# Patient Record
Sex: Male | Born: 1965 | Race: White | Hispanic: No | Smoking: Current every day smoker
Health system: Southern US, Community
[De-identification: ages and names within clinical notes are randomized; demographics above are authoritative.]

## PROBLEM LIST (undated history)

## (undated) DIAGNOSIS — J449 Chronic obstructive pulmonary disease, unspecified: Secondary | ICD-10-CM

## (undated) DIAGNOSIS — K219 Gastro-esophageal reflux disease without esophagitis: Secondary | ICD-10-CM

## (undated) DIAGNOSIS — I10 Essential (primary) hypertension: Secondary | ICD-10-CM

## (undated) DIAGNOSIS — M1990 Unspecified osteoarthritis, unspecified site: Secondary | ICD-10-CM

## (undated) DIAGNOSIS — F419 Anxiety disorder, unspecified: Secondary | ICD-10-CM

## (undated) DIAGNOSIS — Z8719 Personal history of other diseases of the digestive system: Secondary | ICD-10-CM

---

## 2011-05-03 ENCOUNTER — Emergency Department: Payer: Self-pay | Admitting: Emergency Medicine

## 2011-09-26 ENCOUNTER — Ambulatory Visit: Payer: Self-pay | Admitting: Sports Medicine

## 2011-12-05 ENCOUNTER — Other Ambulatory Visit: Payer: Self-pay

## 2011-12-05 ENCOUNTER — Encounter (HOSPITAL_COMMUNITY): Payer: Self-pay | Admitting: Pharmacy Technician

## 2011-12-05 ENCOUNTER — Telehealth: Payer: Self-pay

## 2011-12-05 DIAGNOSIS — K862 Cyst of pancreas: Secondary | ICD-10-CM

## 2011-12-05 NOTE — Telephone Encounter (Signed)
Pt has been scheduled for EUS for 12/07/11 pt has been instructed and meds reviewed pt will call with any questions or concerns

## 2011-12-06 ENCOUNTER — Encounter (HOSPITAL_COMMUNITY): Payer: Self-pay

## 2011-12-06 ENCOUNTER — Encounter (HOSPITAL_COMMUNITY)
Admission: RE | Admit: 2011-12-06 | Discharge: 2011-12-06 | Disposition: A | Discharge status: Home / Self Care | Payer: 59 | Source: Ambulatory Visit | Attending: Surgery | Admitting: Surgery

## 2011-12-06 HISTORY — PX: CERVICAL FUSION: SHX112

## 2011-12-06 HISTORY — DX: Unspecified osteoarthritis, unspecified site: M19.90

## 2011-12-06 HISTORY — DX: Personal history of other diseases of the digestive system: Z87.19

## 2011-12-06 HISTORY — DX: Chronic obstructive pulmonary disease, unspecified: J44.9

## 2011-12-06 HISTORY — DX: Gastro-esophageal reflux disease without esophagitis: K21.9

## 2011-12-06 HISTORY — DX: Essential (primary) hypertension: I10

## 2011-12-06 HISTORY — DX: Anxiety disorder, unspecified: F41.9

## 2011-12-06 LAB — BASIC METABOLIC PANEL
BUN: 14 mg/dL (ref 6–23)
Chloride: 101 mEq/L (ref 96–112)
Creatinine, Ser: 1.21 mg/dL (ref 0.50–1.35)
GFR calc Af Amer: 80 mL/min — ABNORMAL LOW (ref >90)

## 2011-12-06 LAB — CBC
HCT: 46.6 % (ref 39.0–52.0)
MCV: 96.5 fL (ref 78.0–100.0)
RDW: 14.5 % (ref 11.5–15.5)
WBC: 7.1 10*3/uL (ref 4.0–10.5)

## 2011-12-06 NOTE — Patient Instructions (Addendum)
20 Bruce [name]  12/06/2011   Your procedure is scheduled on:  11-14 -2013  Report to [facility] Admitting at    0630    AM .  Call this number if you have problems the morning of surgery: [phone] Endoscopy   Remember:   Do not eat food:After Midnight.    Take these medicines the morning of surgery with A SIP OF WATER: none. Donot take Lisinopril.Bring Dulera and use.    Do not wear jewelry, make-up or nail polish.  Do not wear lotions, powders, or perfumes. You may wear deodorant.  Do not shave 48 hours prior to surgery.(face and neck okay, no shaving of legs)  Do not bring valuables to the hospital.  Contacts, dentures or bridgework may not be worn into surgery.  Leave suitcase in the car. After surgery it may be brought to your room.  For patients admitted to the hospital, checkout time is 11:00 AM the day of discharge.   Patients discharged the day of surgery will not be allowed to drive home. Must have responsible person with you x 24 hours once discharged.  Name and phone number of your driver: Marchelle Folks Krieger,daughter [phone]

## 2011-12-06 NOTE — Pre-Procedure Instructions (Signed)
12-06-11 Pst visit for preprocedure labs-CBC,BMP. Instructions given.W. Kennon Portela

## 2011-12-07 ENCOUNTER — Encounter (HOSPITAL_COMMUNITY): Payer: Self-pay | Admitting: Anesthesiology

## 2011-12-07 ENCOUNTER — Encounter (HOSPITAL_COMMUNITY)
Admission: RE | Disposition: A | Discharge status: Home / Self Care | Payer: Self-pay | Source: Ambulatory Visit | Attending: Gastroenterology

## 2011-12-07 ENCOUNTER — Ambulatory Visit (HOSPITAL_COMMUNITY): Payer: 59 | Admitting: Anesthesiology

## 2011-12-07 ENCOUNTER — Ambulatory Visit (HOSPITAL_COMMUNITY)
Admission: RE | Admit: 2011-12-07 | Discharge: 2011-12-07 | Disposition: A | Discharge status: Home / Self Care | Payer: 59 | Source: Ambulatory Visit | Attending: Gastroenterology | Admitting: Gastroenterology

## 2011-12-07 ENCOUNTER — Encounter (HOSPITAL_COMMUNITY): Payer: Self-pay | Admitting: Gastroenterology

## 2011-12-07 ENCOUNTER — Encounter (HOSPITAL_COMMUNITY): Payer: Self-pay | Admitting: *Deleted

## 2011-12-07 DIAGNOSIS — I10 Essential (primary) hypertension: Secondary | ICD-10-CM | POA: Insufficient documentation

## 2011-12-07 DIAGNOSIS — F1010 Alcohol abuse, uncomplicated: Secondary | ICD-10-CM | POA: Insufficient documentation

## 2011-12-07 DIAGNOSIS — K2960 Other gastritis without bleeding: Secondary | ICD-10-CM | POA: Insufficient documentation

## 2011-12-07 DIAGNOSIS — J449 Chronic obstructive pulmonary disease, unspecified: Secondary | ICD-10-CM | POA: Insufficient documentation

## 2011-12-07 DIAGNOSIS — K219 Gastro-esophageal reflux disease without esophagitis: Secondary | ICD-10-CM | POA: Insufficient documentation

## 2011-12-07 DIAGNOSIS — K862 Cyst of pancreas: Secondary | ICD-10-CM | POA: Insufficient documentation

## 2011-12-07 DIAGNOSIS — J4489 Other specified chronic obstructive pulmonary disease: Secondary | ICD-10-CM | POA: Insufficient documentation

## 2011-12-07 DIAGNOSIS — Z01812 Encounter for preprocedural laboratory examination: Secondary | ICD-10-CM | POA: Insufficient documentation

## 2011-12-07 HISTORY — PX: EUS: SHX5427

## 2011-12-07 LAB — PANC CYST FLD ANLYS-PATHFNDR-TG

## 2011-12-07 SURGERY — UPPER ENDOSCOPIC ULTRASOUND (EUS) LINEAR
Anesthesia: Monitor Anesthesia Care

## 2011-12-07 MED ORDER — CIPROFLOXACIN IN D5W 400 MG/200ML IV SOLN
INTRAVENOUS | Status: AC
  Filled 2011-12-07: qty 200

## 2011-12-07 MED ORDER — BUTAMBEN-TETRACAINE-BENZOCAINE 2-2-14 % EX AERO
INHALATION_SPRAY | CUTANEOUS | Status: DC | PRN
  Administered 2011-12-07: 2 via TOPICAL

## 2011-12-07 MED ORDER — HYDRALAZINE HCL 20 MG/ML IJ SOLN
INTRAMUSCULAR | Status: DC | PRN
  Administered 2011-12-07 (×2): 5 mg via INTRAVENOUS
  Administered 2011-12-07: 2.5 mg via INTRAVENOUS

## 2011-12-07 MED ORDER — MIDAZOLAM HCL 5 MG/5ML IJ SOLN
INTRAMUSCULAR | Status: DC | PRN
  Administered 2011-12-07 (×2): 1 mg via INTRAVENOUS
  Administered 2011-12-07: 2 mg via INTRAVENOUS

## 2011-12-07 MED ORDER — CIPROFLOXACIN IN D5W 400 MG/200ML IV SOLN
INTRAVENOUS | Status: DC | PRN
  Administered 2011-12-07: 400 mg via INTRAVENOUS

## 2011-12-07 MED ORDER — FENTANYL CITRATE 0.05 MG/ML IJ SOLN
INTRAMUSCULAR | Status: DC | PRN
  Administered 2011-12-07: 100 ug via INTRAVENOUS

## 2011-12-07 MED ORDER — SODIUM CHLORIDE 0.9 % IV SOLN
INTRAVENOUS | Status: DC
  Administered 2011-12-07: 07:00:00 via INTRAVENOUS

## 2011-12-07 MED ORDER — CIPROFLOXACIN HCL 500 MG PO TABS: 500.0000 mg | ORAL_TABLET | Freq: Two times a day (BID) | ORAL | Status: AC

## 2011-12-07 MED ORDER — LIDOCAINE HCL 1 % IJ SOLN
INTRAMUSCULAR | Status: DC | PRN
  Administered 2011-12-07: 50 mg via INTRADERMAL

## 2011-12-07 MED ORDER — PROPOFOL 10 MG/ML IV EMUL
INTRAVENOUS | Status: DC | PRN
  Administered 2011-12-07: 150 ug/kg/min via INTRAVENOUS

## 2011-12-07 NOTE — Op Note (Signed)
[facility] [address] [place], [address]   ENDOSCOPIC ULTRASOUND PROCEDURE REPORT PATIENT: Bruce [name], Bruce [name]  MR#: [id number] BIRTHDATE: Jun 13, 1965  GENDER: Male ENDOSCOPIST: Rachael Fee, MD REFERRED BY:  Dr. Cecelia Byars at [facility] in [place], [place] PROCEDURE DATE:  12/07/2011 PROCEDURE:   Upper EUS w/FNA ASA CLASS:      Class III INDICATIONS:   chronic alcohol overuse (6+ etoh drinks a day); recent acute abdominal/chest pain; eventual CT scan showed 4cm cyst in head of pancreas; no weight loss, no family history of pancreatic disease. MEDICATIONS: MAC sedation, administered by CRNA and Cipro 400 mg IV  DESCRIPTION OF PROCEDURE:   After the risks benefits and alternatives of the procedure were  explained, informed consent was obtained. The patient was then placed in the left, lateral, decubitus postion and IV sedation was administered. Throughout the procedure, the patients blood pressure, pulse and oxygen saturations were monitored continuously.  Under direct visualization, the Pentax EUS Radial [id number]  endoscope was introduced through the mouth  and advanced to the second portion of the duodenum .  Water was used as necessary to provide an acoustic interface.  Upon completion of the imaging, water was removed and the patient was sent to the recovery room in satisfactory condition.   Endoscopic findings: 1. Linear erosions in distal stomach, otherwise normal UGI tract. EUS findings: 1. Anechoic (cystic) mass in head of pancreas that is round, well demarcated, measures 5cm maximally, does not appear to involve the main pancreatic duct.  There were floating hyperechoic flecks within the cyst and a 6mm nodule along one wall that appeared to be debris. The cyst fluid was nearly completely aspirated using a single transduodenal pass with a 19 Gauge BS EUS FNA needle. This yeilded 20 cc of think, milkly fluid that was not  particularly malodorous.  The fluid was sent for testing (cytology, CEA and amylase). 2. Pancreatic parenchyma showed hyperchoic strands but was not particularly lobular or honeycombed. 3. Main pancreatic duct was ectatic but not dilated 4. CBD was non-dilated 5. Limited views of liver, spleen, gallblladder were all normal. 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 The cyst was nearly completely aspirated and yielded unusual, milkly appearing fluid which was sent for testing (cytology, CEA, amylase). He will complete 3 days of twice daily cipro.  I am most suspicious for pseudocyst given chronic etoh overuse and recent severe pains that may have been mild acute pancreatitis. (about 6 weeks ago).  Await final fluid testing.    _______________________________ eSigned:  Rachael Fee, MD 12/07/2011 8:33 AM

## 2011-12-07 NOTE — Anesthesia Postprocedure Evaluation (Signed)
Anesthesia Post Note  Patient: Bruce [name]  Procedure(s) Performed: Procedure(s) (LRB): UPPER ENDOSCOPIC ULTRASOUND (EUS) LINEAR (N/A)  Anesthesia type: MAC  Patient location: PACU  Post pain: Pain level controlled  Post assessment: Post-op Vital signs reviewed  Last Vitals: BP 100/68  Pulse 73  Temp 36.4 C (Oral)  Resp 12  SpO2 98%  Post vital signs: Reviewed  Level of consciousness: awake  Complications: No apparent anesthesia complications

## 2011-12-07 NOTE — Transfer of Care (Signed)
Immediate Anesthesia Transfer of Care Note  Patient: Bruce [name]  Procedure(s) Performed: Procedure(s) (LRB) with comments: UPPER ENDOSCOPIC ULTRASOUND (EUS) LINEAR (N/A)  Patient Location: PACU and Endoscopy Unit  Anesthesia Type:MAC  Level of Consciousness: awake, alert  and oriented  Airway & Oxygen Therapy: Patient Spontanous Breathing and Patient connected to nasal cannula oxygen  Post-op Assessment: Report given to PACU RN, Post -op Vital signs reviewed and stable and Patient moving all extremities  Post vital signs: Reviewed and stable  Complications: No apparent anesthesia complications

## 2011-12-07 NOTE — Anesthesia Preprocedure Evaluation (Addendum)
Anesthesia Evaluation  Patient identified by MRN, date of birth, ID band Patient awake    Reviewed: Allergy & Precautions, H&P , NPO status , Patient's Chart, lab work & pertinent test results  Airway Mallampati: III TM Distance: >3 FB Neck ROM: Full    Dental  (+) Dental Advisory Given, Poor Dentition, Missing and Chipped   Pulmonary COPD COPD inhaler, Current Smoker,  breath sounds clear to auscultation  Pulmonary exam normal       Cardiovascular hypertension, Pt. on medications - Past MI Rhythm:Regular Rate:Normal     Neuro/Psych Anxiety negative neurological ROS     GI/Hepatic Neg liver ROS, hiatal hernia, GERD-  Medicated,  Endo/Other  negative endocrine ROS  Renal/GU negative Renal ROS     Musculoskeletal negative musculoskeletal ROS (+)   Abdominal (+) + obese,   Peds  Hematology negative hematology ROS (+)   Anesthesia Other Findings   Reproductive/Obstetrics                         Anesthesia Physical Anesthesia Plan  ASA: III  Anesthesia Plan: MAC   Post-op Pain Management:    Induction: Intravenous  Airway Management Planned: Simple Face Mask  Additional Equipment:   Intra-op Plan:   Post-operative Plan:   Informed Consent: I have reviewed the patients History and Physical, chart, labs and discussed the procedure including the risks, benefits and alternatives for the proposed anesthesia with the patient or authorized representative who has indicated his/her understanding and acceptance.   Dental advisory given  Plan Discussed with: CRNA  Anesthesia Plan Comments:        Anesthesia Quick Evaluation

## 2011-12-07 NOTE — H&P (Signed)
  HPI: This is a  Man with chronic etoh overuse (6+ etoh bevs per day).  Had abdominal pain for 10 days.  Eventual abd CT that showed cystic lesion in head of pancreas.  Was referred here    Past Medical History  Diagnosis Date  . Hypertension   . Anxiety   . COPD (chronic obstructive pulmonary disease)   . GERD (gastroesophageal reflux disease)   . H/O hiatal hernia   . 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 Cervical fusion 12-06-11    '10- Cervical fusion retained hardware-(fell crushed vertebrae)    Current Facility-Administered Medications  Medication Dose Route Frequency Provider Last Rate Last Dose  . 0.9 %  sodium chloride infusion   Intravenous Continuous Rachael Fee, MD        Allergies as of 12/05/2011  . (No Known Allergies)    History reviewed. No pertinent family history.  History   Social History  . Marital Status: Legally Separated    Spouse Name: N/A    Number of Children: N/A  . Years of Education: N/A   Occupational History  . Not on file.   Social History Main Topics  . Smoking status: Current Every Day Smoker -- 1.0 packs/day for 30 years  . Smokeless tobacco: Not on file  . Alcohol Use: Yes     Comment: - 6-8 dks per day(liquor/beer)-trying to cut back  . Drug Use: No  . Sexually Active: Yes   Other Topics Concern  . Not on file   Social History Narrative  . No narrative on file      Physical Exam: BP 171/111  Pulse 73  Temp 98.3 F (36.8 C) (Oral)  Resp 17  SpO2 97% Constitutional: generally well-appearing Psychiatric: alert and oriented x3 Abdomen: soft, nontender, nondistended, no obvious ascites, no peritoneal signs, normal bowel sounds     Assessment and plan: 46 y.o. male with pancreatic lesion  For upper EUS today, +/- FNA

## 2011-12-08 ENCOUNTER — Encounter (HOSPITAL_COMMUNITY): Payer: Self-pay | Admitting: Gastroenterology

## 2011-12-22 ENCOUNTER — Encounter: Payer: Self-pay | Admitting: Gastroenterology

## 2012-02-07 ENCOUNTER — Inpatient Hospital Stay: Payer: Self-pay | Admitting: Psychiatry

## 2012-02-07 LAB — DRUG SCREEN, URINE
Amphetamines, Ur Screen: NEGATIVE (ref ?–1000)
Benzodiazepine, Ur Scrn: NEGATIVE (ref ?–200)
Cannabinoid 50 Ng, Ur ~~LOC~~: NEGATIVE (ref ?–50)
MDMA (Ecstasy)Ur Screen: NEGATIVE (ref ?–500)
Methadone, Ur Screen: NEGATIVE (ref ?–300)
Opiate, Ur Screen: NEGATIVE (ref ?–300)
Phencyclidine (PCP) Ur S: NEGATIVE (ref ?–25)

## 2012-02-07 LAB — COMPREHENSIVE METABOLIC PANEL
Albumin: 4.5 g/dL (ref 3.4–5.0)
Anion Gap: 6 — ABNORMAL LOW (ref 7–16)
Bilirubin,Total: 0.7 mg/dL (ref 0.2–1.0)
Creatinine: 1.21 mg/dL (ref 0.60–1.30)
Glucose: 184 mg/dL — ABNORMAL HIGH (ref 65–99)
Osmolality: 265 (ref 275–301)
SGPT (ALT): 87 U/L — ABNORMAL HIGH (ref 12–78)
Sodium: 130 mmol/L — ABNORMAL LOW (ref 136–145)
Total Protein: 8.6 g/dL — ABNORMAL HIGH (ref 6.4–8.2)

## 2012-02-07 LAB — URINALYSIS, COMPLETE
Bacteria: NONE SEEN
Bilirubin,UR: NEGATIVE
Ph: 6 (ref 4.5–8.0)
Protein: NEGATIVE
RBC,UR: 1 /HPF (ref 0–5)
Specific Gravity: 1.008 (ref 1.003–1.030)
Squamous Epithelial: <1

## 2012-02-07 LAB — ETHANOL: Ethanol %: 0.328 % (ref 0.000–0.080)

## 2012-02-07 LAB — CBC
HGB: 19.8 g/dL — ABNORMAL HIGH (ref 13.0–18.0)
MCH: 35.6 pg — ABNORMAL HIGH (ref 26.0–34.0)
MCHC: 34.6 g/dL (ref 32.0–36.0)
RBC: 5.58 10*6/uL (ref 4.40–5.90)
RDW: 15.2 % — ABNORMAL HIGH (ref 11.5–14.5)
WBC: 6.1 10*3/uL (ref 3.8–10.6)

## 2012-02-07 LAB — TSH: Thyroid Stimulating Horm: 1.56 u[IU]/mL

## 2012-02-07 LAB — LIPASE, BLOOD: Lipase: 277 U/L (ref 73–393)

## 2012-03-09 ENCOUNTER — Other Ambulatory Visit: Payer: Self-pay

## 2012-03-14 LAB — COMPREHENSIVE METABOLIC PANEL
Albumin: 4 g/dL (ref 3.4–5.0)
Anion Gap: 6 — ABNORMAL LOW (ref 7–16)
Bilirubin,Total: 0.3 mg/dL (ref 0.2–1.0)
Calcium, Total: 9 mg/dL (ref 8.5–10.1)
Chloride: 110 mmol/L — ABNORMAL HIGH (ref 98–107)
Co2: 31 mmol/L (ref 21–32)
EGFR (African American): >60
EGFR (Non-African Amer.): 55 — ABNORMAL LOW
Glucose: 106 mg/dL — ABNORMAL HIGH (ref 65–99)
Osmolality: 293 (ref 275–301)
SGOT(AST): 34 U/L (ref 15–37)

## 2012-03-14 LAB — CBC
HGB: 18.5 g/dL — ABNORMAL HIGH (ref 13.0–18.0)
WBC: 8.3 10*3/uL (ref 3.8–10.6)

## 2012-03-14 LAB — ETHANOL: Ethanol: 280 mg/dL

## 2012-03-15 ENCOUNTER — Inpatient Hospital Stay: Payer: Self-pay | Admitting: Psychiatry

## 2012-04-02 ENCOUNTER — Emergency Department: Payer: Self-pay | Admitting: Emergency Medicine

## 2012-11-28 ENCOUNTER — Other Ambulatory Visit: Payer: Self-pay

## 2014-04-17 IMAGING — CR DG FEMUR 2V*R*
1 series · 4 of 4 positions shown · non-contrast
Comparison: none

REASON FOR EXAM: pt fell down 15 steps, pain to right leg
COMMENTS:   May transport without cardiac monitor

PROCEDURE:     DXR - DXR FEMUR RIGHT  - March 15, 2012  [DATE]
RESULT:     Images of the right femur demonstrate no fracture, dislocation
or foreign body.

[Series 1: t femur proximal ap right · 0.14mm/px · 4 of 4 slices shown]
[im 1/4]
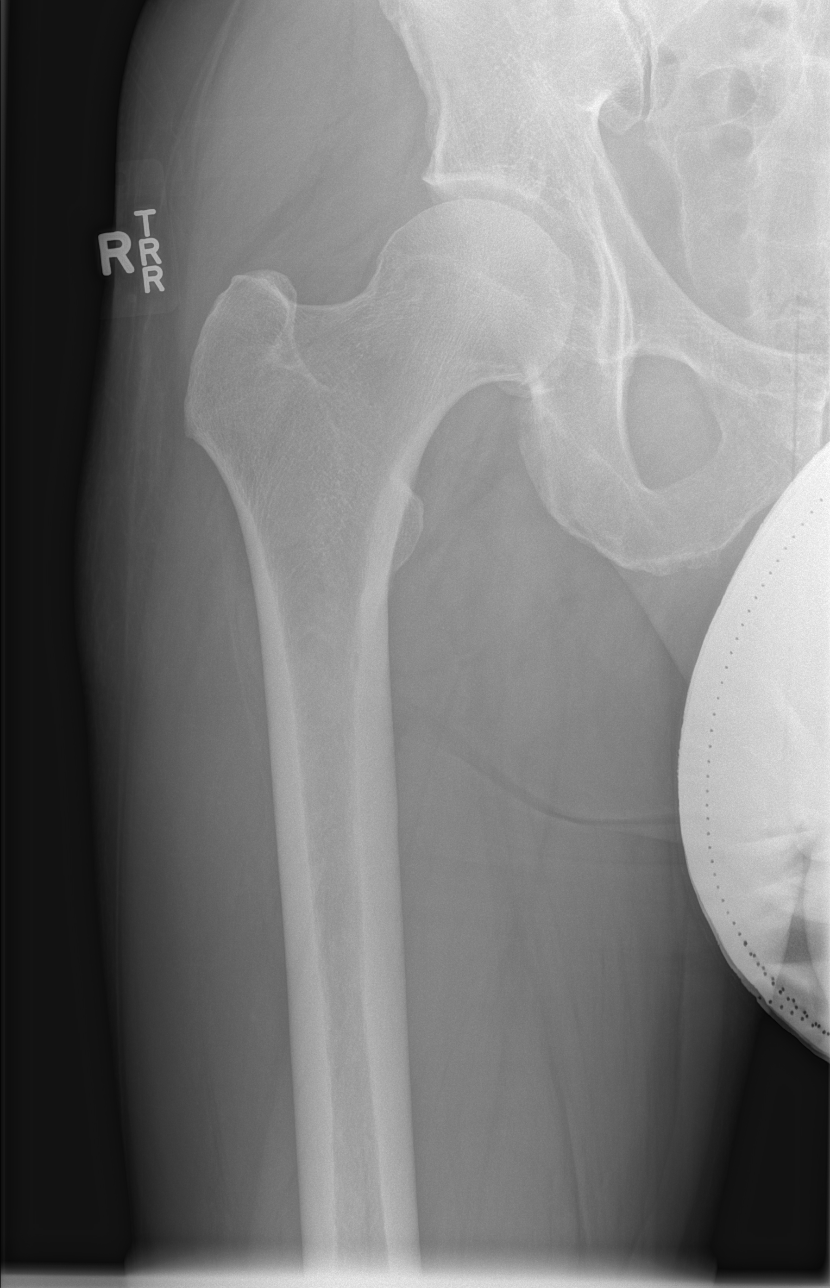
[im 2/4]
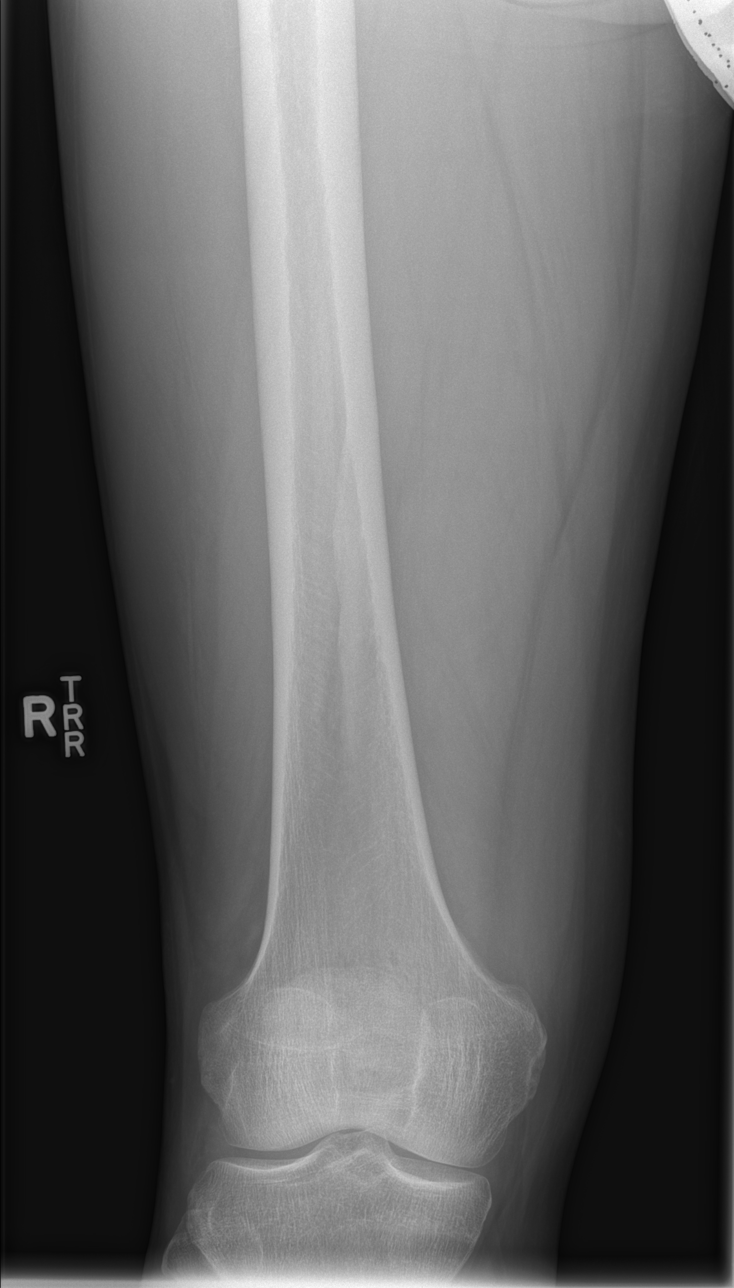
[im 3/4]
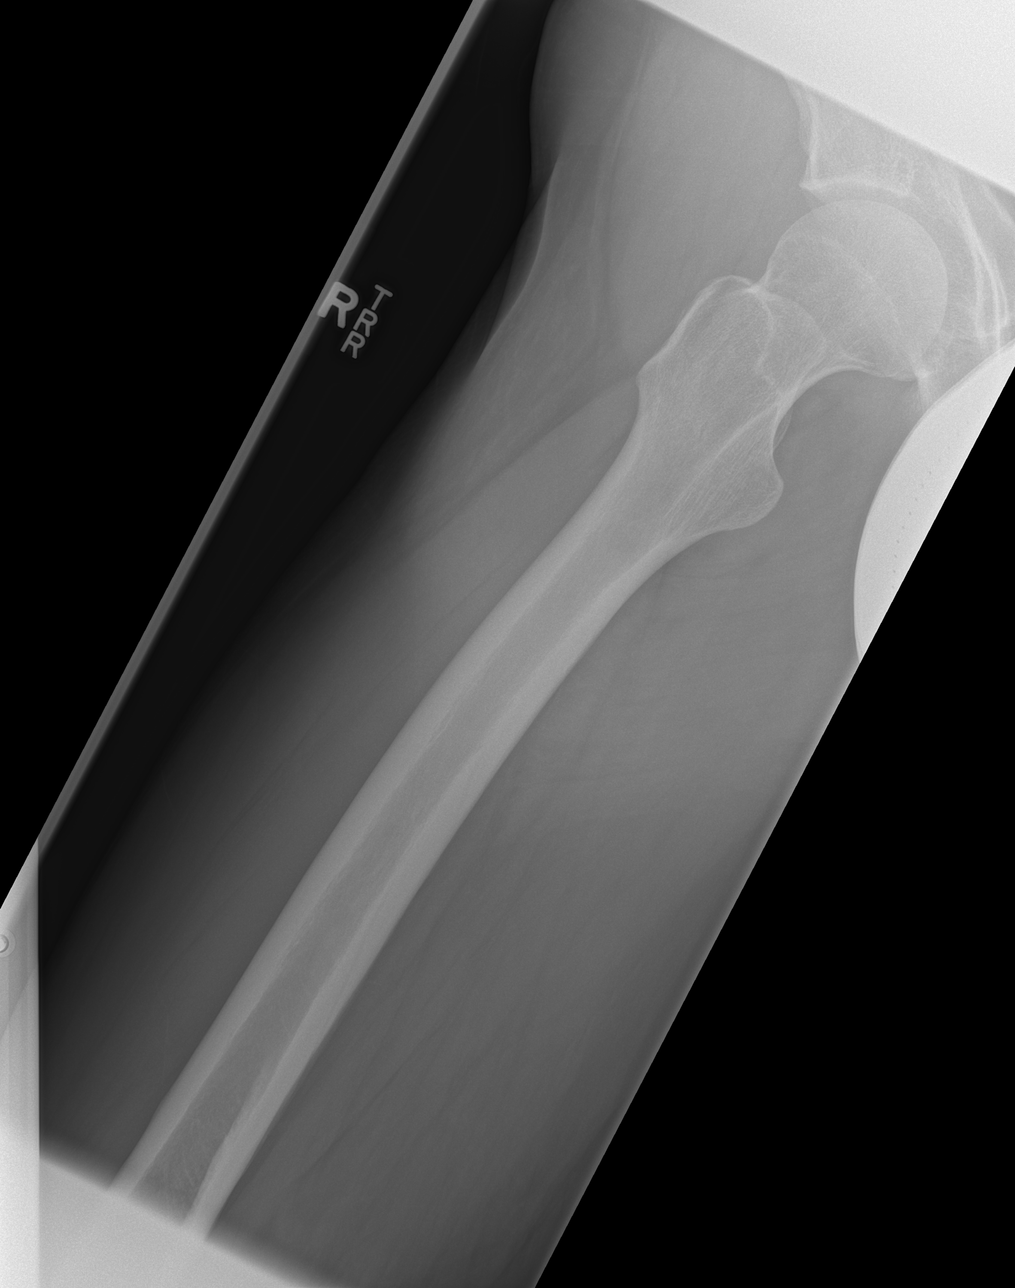
[im 4/4]
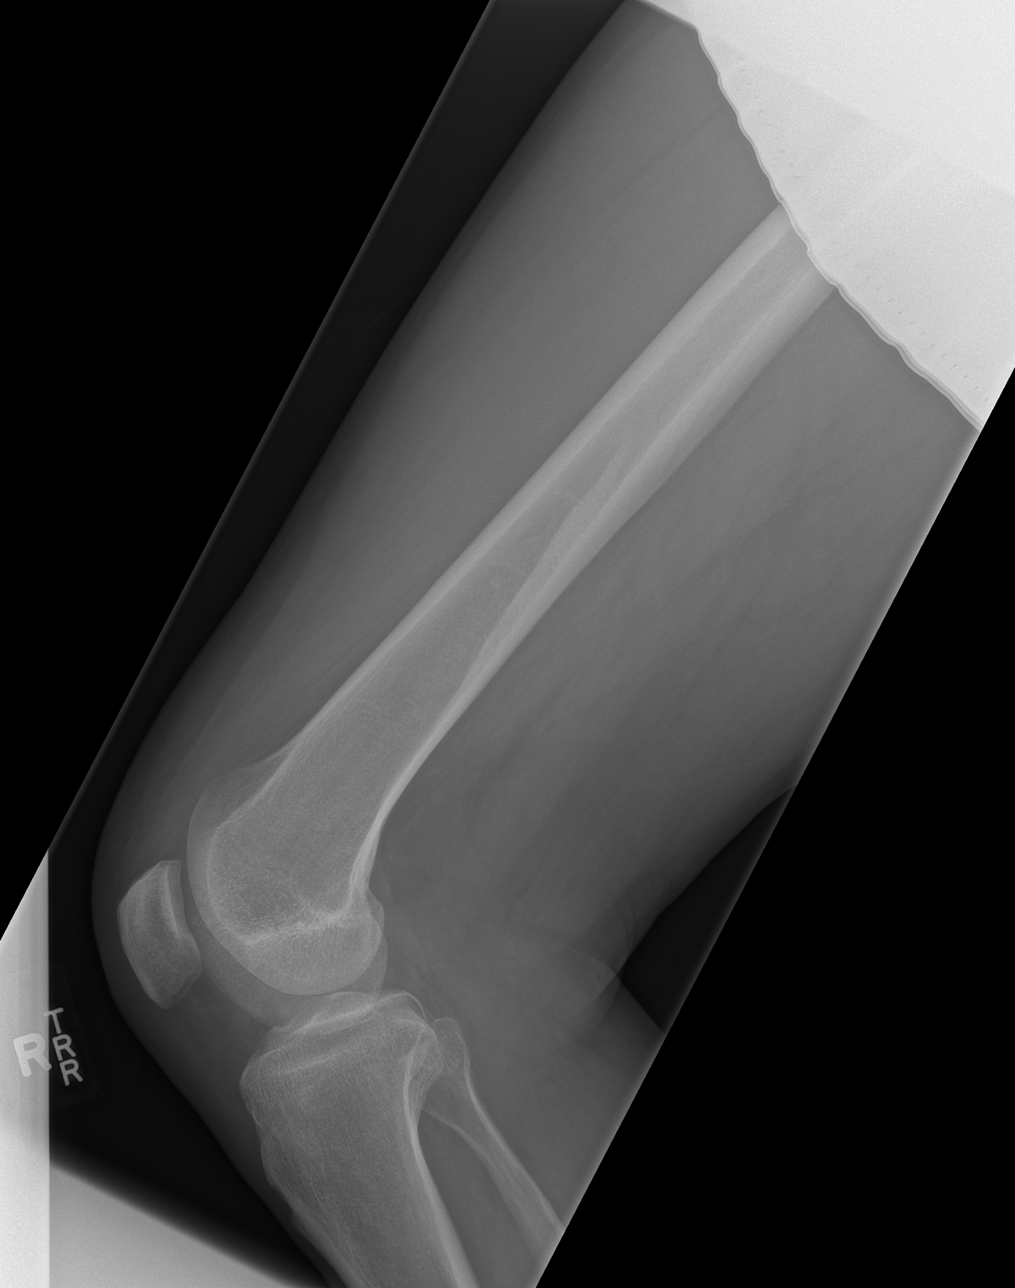

[4 of 4 positions shown; findings below may reference images not displayed]

IMPRESSION: Please see above.

[REDACTED]

## 2014-05-15 NOTE — Discharge Summary (Signed)
PATIENT NAME:  Bruce [name], Bruce [name] MR#:  [id number] DATE OF BIRTH:  May 05, 1965  DATE OF ADMISSION:  02/07/2012 DATE OF DISCHARGE:  02/09/2012  HOSPITAL COURSE: See dictated history and physical for details of admission. This 49 year old man was admitted for alcohol detox. At the time of admission, he was intoxicated with a very high alcohol level. He was also reporting some symptoms of depression. The patient was put on a standard detox protocol, which he tolerated well and required little of. He was able to come off of Ativan easily and did not show any signs of seizures or delirium. Vital signs were stable. The patient was denying suicidal ideation and requesting discharge within 2 days. Because of his history of ongoing substance abuse, it was recommended that he avail himself of the intensive outpatient program if he was not willing to go to inpatient rehab. The patient was evaluated by the intensive outpatient substance abuse program, Dr. Maisie Fushomas and was accepted. The patient was discharged from the hospital with immediate substance abuse followup at the IOP program.   LABORATORY DATA: Admission labs showed a drug screen that was negative. Urinalysis was 2+ blood. No sign of infection. Lipase normal at 277. TSH normal at 1.56. Admission alcohol level 328. Glucose on admission 184, sodium low at 130. Alkaline phosphatase elevated at 145, ALT elevated at 87, AST elevated at 83. Hematocrit elevated at 57.4, hemoglobin elevated at 19.8.   DISCHARGE MEDICATIONS: Trazodone 100 mg p.o. at bedtime, albuterol inhaler 2 puffs q. 6 hours p.[name].n. for shortness of breath and lisinopril 10 mg per day.   MENTAL STATUS EXAM AT DISCHARGE: Neatly dressed and groomed man, looks his stated age. Cooperative with the interview. Psychomotor activity normal. His affect was mood stated as fine. Thoughts lucid and directed. No signs of loosening of associations or delusions. Denies auditory or visual hallucinations. Denies suicidal or  homicidal ideation. Insight and judgment adequate. Normal intelligence.   DIAGNOSIS PRINCIPLE AND PRIMARY: AXIS I: Alcohol dependence.   SECONDARY DIAGNOSES:  AXIS I: Substance-induced mood disorder.  AXIS II: Deferred.  AXIS III: Gastric reflux, mild asthma and high blood pressure.  AXIS IV: Moderate. Ongoing stress from relapse into substance abuse.  AXIS V: Functioning at time of discharge is 55.    ____________________________ Audery AmelJohn T. Kilian Schwartz, MD jtc:aw D: 02/27/2012 23:49:48 ET T: 02/28/2012 07:36:01 ET JOB#: [id number]  cc: Audery AmelJohn T. Loveah Like, MD, <Dictator> Audery AmelJOHN T Georgina Krist MD ELECTRONICALLY SIGNED 02/29/2012 0:23

## 2014-05-15 NOTE — H&P (Signed)
PATIENT NAME:  Bruce [name], Bruce [name] MR#:  [id number] DATE OF BIRTH:  [birth year]  DATE OF ADMISSION:  02/07/2012  IDENTIFYING INFORMATION: This is a 49 year old man presented to the Emergency Room requesting detox.   CHIEF COMPLAINT: "I need detox."   HISTORY OF PRESENT ILLNESS: Information obtained from the patient and the chart. The patient reports that he drinks about a fifth of liquor a day and has been doing so steadily, at least since this past summer. He has been feeling more sick and run down and hopeless about his alcohol use. He says that today he was feeling like he was going to die although he cannot really specify exactly in what way. His mood has been somewhat down but not consistently depressed. Energy level lower but still able to work. Sleep interrupted. Denies suicidal or homicidal ideation. He denies any history of seizures, does have a history of blackouts. Denies that he is abusing other drugs but says that he is prescribed Restoril by his primary care doctor.   PAST PSYCHIATRIC HISTORY: Has had admission to hospitals for detox. Has not had other psychiatric treatment. No history of suicide attempts. No history of diagnosed mood disorders. No history of psychosis.   PAST MEDICAL HISTORY: The patient has high blood pressure, gastric reflux symptoms, chronic COPD related to smoking.   SOCIAL HISTORY: Lives alone but has a girlfriend. She is there part of the time. [place] He repairs air compressors for a living.   REVIEW OF SYSTEMS: Feels somewhat sick, run down, tired; feels negative about himself about his drinking. Denies suicidal ideation. Denies psychotic symptoms. Not having acute pain.   MEDICATIONS: Lisinopril probably 10 mg a day, Prilosec probably 20 mg twice a day, tramadol p.[name].n. for pain, Restoril 15 to 30 mg at night p.[name].n. for sleep.   ALLERGIES: No known drug allergies.   MENTAL STATUS EXAMINATION:  The patient presents as  alert and oriented, cooperative with the exam, appropriate in his interaction. Eye contact good. Psychomotor activity normal without sign of tremor. Speech was normal in rate, tone and volume. Affect somewhat blunted. Mood stated as being a little bit run down. Thoughts are lucid without any loosening of associations or delusional thinking. No sign of thought disorder. Denies auditory or visual hallucinations. Denies suicidal or homicidal ideation. Judgment and insight adequate to the situation. Short and long-term memory grossly intact. Alert and oriented x 4, normal intelligence.   PHYSICAL EXAMINATION: GENERAL: Skin is red all over but no acute skin lesions.  HEENT: Pupils equal and reactive. Face symmetric.  NECK AND BACK: Nontender.  MUSCULOSKELETAL: Full range of motion at all extremities. Normal gait. Strength and reflexes symmetric and normal throughout.  LUNGS: Diffuse inspiratory and expiratory wheezes.  HEART: Regular rate and rhythm.  ABDOMEN: Soft, nontender, normal bowel sounds.  VITAL SIGNS: Temperature 98.6, pulse 120, respirations 16, blood pressure 140/94.   LABORATORY RESULTS: Drug screen negative. Urinalysis positive for 2+ blood, trace leukocyte esterase. Lipase normal at 277. TSH normal at 1.56. Alcohol level on presentation 328. ALT elevated at 87, AST elevated at 83, total protein elevated at 8.6, alkaline phosphatase elevated at 145. Sodium low at 130, glucose elevated at 184. Hematocrit elevated at 57.4, white count normal, platelet count normal.    ASSESSMENT: A 49 year old man with alcohol dependence, requests detox. Heavy alcohol use, elevated blood alcohol level on admission. Has not been able to stop on his own. At risk for complicated withdrawal.  TREATMENT PLAN: Admit to psychiatry. Normal detox protocol. Engage the patient in daily groups and activities about substance abuse education. Encourage possible referral to the alcohol and drug abuse treatment center.    DIAGNOSIS PRINCIPLE AND PRIMARY:  AXIS I: Alcohol dependence.   SECONDARY DIAGNOSES: AXIS I: No further.  AXIS II: No diagnosis.  AXIS III: Alcohol intoxication, hypertension, gastric reflux symptoms, chronic obstructive pulmonary disease.  AXIS IV: Moderate to severe from the difficulty from his substance abuse.  AXIS V: Functioning at time of admission 30.       ____________________________ Audery Amel, MD jtc:cs D: 02/07/2012 18:41:17 ET T: 02/07/2012 19:04:35 ET JOB#: [id number]  cc: Audery Amel, MD, <Dictator> Audery Amel MD ELECTRONICALLY SIGNED 02/07/2012 23:26

## 2014-05-15 NOTE — Consult Note (Signed)
Consult: Met with patient this AM as requested by staff to interview for possible treatment in the Chemical Dependency Intensive Outpatient Program at [facility].  Patient indicated that he needs to get back to work and that he will make contact with his EAP in an attempt to determine the possibility of beginning treatment in the CD IOP.  He also indicated that he does want sobriety and that he plans on participating in some type of treatment program but not sure as to what type it will be and that he plans to follow the lead of his employer's EAP and hopefully they will allow him to come to [facility] for his treatment. Patient was oriented to the CD-IOP and indicated that he still has the literature from his previous [facility] Beh Med treatment in which he was oriented to the CD-IOP. He indicated his plans are to be discharged today. He revealed that he did sign an AMA discharge request which is up at 10:00 AM today. Patient was limited in committing to participation in the CD-IOP. Assigned nurse informed of patient's plans at discharge to include possibility of not participating in the [facility]?s CD-IOP.   Electronic Signatures: Thomas, Richard (PsyD)  (Signed on 24-Feb-14 09:55)  Authored  Last Updated: 24-Feb-14 09:55 by Thomas, Richard (PsyD)  

## 2014-05-15 NOTE — Consult Note (Signed)
Brief Consult Note: Diagnosis: alcohol dependence.   Patient was seen by consultant.   Recommend further assessment or treatment.   Orders entered.   Comments: Psychiatry: Admit for alcohol detox.  Electronic Signatures: Audery Amellapacs, John T (MD)  (Signed 15-Jan-14 18:33)  Authored: Brief Consult Note   Last Updated: 15-Jan-14 18:33 by Audery Amellapacs, John T (MD)

## 2014-05-15 NOTE — Consult Note (Signed)
Consult: treatment recommendations This patient was seen at 1:44 PM the request of Dr. Mordecai RasmussenJohn Clapacs, MD in order to explore the possibility of CD-IOP at this inpatient discharge. He was interviewed and oriented to the [facility]?s Chemical Dependency Intensive Outpatient Program (CD-IOP).  Patient has agreed to consider participation in the CD-IOP after Discharge from the Behavioral Medicine Inpatient Unit. He was interviewed and oriented to the [facility]?s Chemical Dependency Intensive Outpatient Program (CD-IOP). He does not believe that he will have a conflict in his attendance of the CD-IOP due to work schedule.  d patient to attend CD-IOP as planned after discharge of the Inpatient Beh Med unit.    Electronic Signatures: Huel Cotehomas, Richard (PsyD)  (Signed on 16-Jan-14 22:41)  Authored  Last Updated: 16-Jan-14 22:41 by Huel Cotehomas, Richard (PsyD)

## 2014-06-02 NOTE — H&P (Signed)
PATIENT NAMIvar Bury:  Bruce [name], Bruce [name] [id number] OF BIRTH:  1965-08-12 OF ADMISSION:  03/15/2012 PHYSICIAN: Daryel NovemberJonathan Williams, MDPHYSICIAN: Kristine LineaJolanta Carolyn Maniscalco, MD DATA: Mr. Bruce [name] is a 49 year old man with alcoholism.   COMPLAINT: "I need detox."  OF PRESENT ILLNESS: Mr. Bruce [name] was hospitalized at [facility] for alcohol detox about one month ago. He immediately relapsed on alcohol and has been drinking a fifth of liquor a day. He denies depressive symptoms or anxiety. There are no psychotic symptoms. He denies illicit drugs or prescription pill abuse. Sleep has been a problem.  PSYCHIATRIC HISTORY: Has had admission to hospitals for detox. He has not had other psychiatric treatment or suicide attempts.   FAMILY PSYCHIATRIC HISTORY: None reported.   PAST MEDICAL HISTORY: HTN. GERD. Chronic pain.   No known drug allergies.  ON ADMISSION:  Lisinopril 10 mg a day.  HCTZ 12.5 mg daily.  Tramadol 50 mg every 6 hours as needed for pain.  HISTORY: Lives alone but has a girlfriend. She is there part of the time. [place] He repairs air compressors for a living.  OF SYSTEMS: CONSTITUTIONAL: No fevers or chills. No weight changes. Positive for fatigue. EYES: No double or blurred vision. ENT: No hearing loss. RESPIRATORY: No shortness of breath or cough. CARDIOVASCULAR: No chest pain or orthopnea. GASTROINTESTINAL: No abdominal pain, nausea, vomiting, or diarrhea. GENITOURINARY: No incontinence or frequency. ENDOCRINE: No heat or cold intolerance. LYMPHATIC: No anemia or easy bruising. INTEGUMENTARY: No acne or rash. MUSCULOSKELETAL: No muscle or joint pain. NEUROLOGIC: No tingling or weakness. PSYCHIATRIC: See history of present illness for details.  EXAMINATION: VITAL SIGNS: Blood pressure 166/109, pulse 105, respirations 20, temperature 98.8. GENERAL: This is a well-developed gentleman looking older than his stated age, in no acute distress. HEENT: The pupils are equal, round, and  reactive to light. Sclerae are anicteric. NECK: Supple. No thyromegaly. LUNGS: Clear to auscultation. No dullness to percussion. HEART: Regular rhythm and rate. No murmurs, rubs, or gallops. ABDOMEN: Soft, nontender, nondistended. Positive bowel sounds. MUSCULOSKELETAL: Normal muscle strength in all extremities. SKIN: No rashes or bruises. LYMPHATIC: No cervical adenopathy. NEUROLOGICAL: Cranial nerves II through XII are intact.  DATA: Chemistries are within normal limits except for blood glucose of 184. Blood alcohol level is 0.280.  LFTs: within nonmal limits. Urine tox screen positive for benzodiazepines. CBC within normal limit.  STATUS EXAMINATION ON ADMISSION: The patient is asleep but easily arousable. He is oriented to person, place, time, and situation. He is pleasant, polite, and cooperative. He is wearing hospital scrubs. He maintains good eye contact. His speech is soft and slow. Mood is okay with anxious affect. Thought processing is logical and goal oriented with poverty of thought. He denies thoughts of hurting himself or others. There are no delusions or paranoia. He denies auditory or visual hallucinations. He has a history of complicated detox but did not experience any symptoms so far. His cognition is grossly intact. His insight and judgment are questionable.  RISK ASSESSMENT ON ADMISSION: This is a patient with a long history of alcoholism who relapsed on alcohol right after detox and requests another alcohol detox.  DIAGNOSES: I:   Alcohol dependence. II:  Deferred. III:  Hypertension, GERD, chronic pain.  IV:  Substance abuse, occupational.  V:  Global Assessment of Functioning score on admission 35.  The patient was admitted to [facility] for safety, stabilization and medication management. He was initially placed on suicide precautions and was closely monitored  for any unsafe behaviors. He underwent full psychiatric and risk assessment. He  received pharmacotherapy, individual and group psychotherapy, substance abuse counseling, and support from therapeutic milieu.   Alcohol detox: the patient is on CIWA protocol.  Medical: We will continue antihypertensives.   Substance abuse treatment: The patient is not interested.   4. Disposition: He will return to home.    Electronic Signatures: Kristine LineaPucilowska, Emillee Talsma (MD)  (Signed on 24-Feb-14 08:29)  Authored  Last Updated: 24-Feb-14 08:29 by Kristine LineaPucilowska, Laith Antonelli (MD)
